# Patient Record
Sex: Male | Born: 1995 | Race: Black or African American | Hispanic: No | Marital: Single | Smoking: Never smoker
Health system: Southern US, Community
[De-identification: ages and names within clinical notes are randomized; demographics above are authoritative.]

## PROBLEM LIST (undated history)

## (undated) DIAGNOSIS — J45909 Unspecified asthma, uncomplicated: Secondary | ICD-10-CM

---

## 2013-10-18 ENCOUNTER — Encounter (HOSPITAL_COMMUNITY): Payer: Self-pay | Admitting: Emergency Medicine

## 2013-10-18 ENCOUNTER — Emergency Department (HOSPITAL_COMMUNITY)
Admission: EM | Admit: 2013-10-18 | Discharge: 2013-10-19 | Disposition: A | Discharge status: Home / Self Care | Payer: No Typology Code available for payment source | Attending: Emergency Medicine | Admitting: Emergency Medicine

## 2013-10-18 DIAGNOSIS — R51 Headache: Secondary | ICD-10-CM

## 2013-10-18 DIAGNOSIS — Y92410 Unspecified street and highway as the place of occurrence of the external cause: Secondary | ICD-10-CM | POA: Insufficient documentation

## 2013-10-18 DIAGNOSIS — Y9389 Activity, other specified: Secondary | ICD-10-CM | POA: Insufficient documentation

## 2013-10-18 DIAGNOSIS — S0990XA Unspecified injury of head, initial encounter: Secondary | ICD-10-CM | POA: Insufficient documentation

## 2013-10-18 DIAGNOSIS — R42 Dizziness and giddiness: Secondary | ICD-10-CM | POA: Insufficient documentation

## 2013-10-18 DIAGNOSIS — R519 Headache, unspecified: Secondary | ICD-10-CM

## 2013-10-18 DIAGNOSIS — J45909 Unspecified asthma, uncomplicated: Secondary | ICD-10-CM | POA: Insufficient documentation

## 2013-10-18 HISTORY — DX: Unspecified asthma, uncomplicated: J45.909

## 2013-10-18 MED ORDER — KETOROLAC TROMETHAMINE 60 MG/2ML IM SOLN
60.0000 mg | Freq: Once | INTRAMUSCULAR | Status: AC
  Administered 2013-10-18: 60 mg via INTRAMUSCULAR
  Filled 2013-10-18: qty 2
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
## 2013-10-18 NOTE — Discharge Instructions (Signed)
May continue taking over the counter medications as needed for headache-- motrin, excedrin migraine or tension headache, tylenol, etc Return to the ED for new or worsening symptoms
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
## 2013-10-19 NOTE — ED Notes (Signed)
pateint went home during down time

## 2013-10-19 NOTE — ED Provider Notes (Signed)
Medical screening examination/treatment/procedure(s) were performed by non-physician practitioner and as supervising physician I was immediately available for consultation/collaboration.  EKG Interpretation   None        Irmgard Rampersaud R. Zanaiya Calabria, MD 10/19/13 1836 

## 2015-12-17 ENCOUNTER — Encounter (HOSPITAL_COMMUNITY): Payer: Self-pay | Admitting: Emergency Medicine

## 2015-12-17 ENCOUNTER — Emergency Department (HOSPITAL_COMMUNITY)
Admission: EM | Admit: 2015-12-17 | Discharge: 2015-12-17 | Disposition: A | Discharge status: Home / Self Care | Payer: No Typology Code available for payment source | Attending: Emergency Medicine | Admitting: Emergency Medicine

## 2015-12-17 DIAGNOSIS — K120 Recurrent oral aphthae: Secondary | ICD-10-CM

## 2015-12-17 DIAGNOSIS — J069 Acute upper respiratory infection, unspecified: Secondary | ICD-10-CM | POA: Insufficient documentation

## 2015-12-17 DIAGNOSIS — J45909 Unspecified asthma, uncomplicated: Secondary | ICD-10-CM | POA: Insufficient documentation

## 2015-12-17 DIAGNOSIS — Z7951 Long term (current) use of inhaled steroids: Secondary | ICD-10-CM | POA: Insufficient documentation

## 2015-12-17 MED ORDER — MAGIC MOUTHWASH W/LIDOCAINE: 5.0000 mL | Freq: Three times a day (TID) | ORAL | Status: AC

## 2015-12-17 NOTE — ED Notes (Signed)
Per pt, states dry mouth since eating something cooked in oil that shrimp was fried in, states cough and congestion

## 2015-12-17 NOTE — ED Provider Notes (Signed)
CSN: [id number]     Arrival date & time 12/17/15  1318 History  By signing my name below, I, Gabriel Peterson, attest that this documentation has been prepared under the direction and in the presence of Arthor CaptainAbigail Edrees Valent, PA-C. Electronically Signed: Ronney LionSuzanne Peterson, ED Scribe. 12/17/2015. 4:01 PM.    Chief Complaint  Patient presents with  . dry mouth    The history is provided by the patient. No language interpreter was used.   HPI Comments: Gabriel Peterson is a 20 y.o. male with a history of asthma, who presents to the Emergency Department complaining of the sensation of a dry mouth that began about 4 days ago, when he woke up. 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 However, he states there was no actual shrimp in his meal but was told that the restaurant sometimes cooks shrimp in the kitchen. He states he felt no immediate symptoms but woke up the next day with the sensation of a dry mouth. He states that yesterday, he also developed a rhinorrhea, cough, and intermittent chest pain that only occurs with his cough. He states he has never experienced this same dry mouth sensation before. Patient states he is able to chew and swallow without difficulty. He states he had taken Benadryl twice with no relief to his dry mouth sensation. He denies eye itching.   Past Medical History  Diagnosis Date  . Asthma    History reviewed. No pertinent past surgical history. No family history on file. Social History  Substance Use Topics  . Smoking status: Never Smoker   . Smokeless tobacco: Never Used  . Alcohol Use: No    Review of Systems  HENT: Positive for rhinorrhea. Negative for trouble swallowing.   Eyes: Negative for itching.  Respiratory: Positive for cough.   Cardiovascular: Positive for chest pain (only with cough).    Allergies  Shrimp  Home Medications   Prior to Admission medications    Medication Sig Start Date End Date Taking? Authorizing Provider  Fluticasone-Salmeterol (ADVAIR DISKUS) 250-50 MCG/DOSE AEPB Inhale 1 puff into the lungs 2 (two) times daily.    Historical Provider, MD   BP 124/74 mmHg  Pulse 73  Temp(Src) 98.7 F (37.1 C) (Oral)  Resp 18  SpO2 100% Physical Exam  Constitutional: He is oriented to person, place, and time. He appears well-developed and well-nourished. No distress.  HENT:  Head: Normocephalic and atraumatic.  Mouth/Throat: Uvula is midline, oropharynx is clear and moist and mucous membranes are normal. No oropharyngeal exudate.  Small ulceration on the lip. Mucous membranes are moist.   Eyes: Conjunctivae and EOM are normal.  Neck: Neck supple. No tracheal deviation present.  Cardiovascular: Normal rate.   Pulmonary/Chest: Effort normal. No respiratory distress.  Musculoskeletal: Normal range of motion.  Neurological: He is alert and oriented to person, place, and time.  Skin: Skin is warm and dry.  Psychiatric: He has a normal mood and affect. His behavior is normal.  Nursing note and vitals reviewed.   ED Course  Procedures (including critical care time)  DIAGNOSTIC STUDIES: Oxygen Saturation is 100% on RA, normal by my interpretation.    COORDINATION OF CARE: 3:53 PM - Discussed treatment plan with pt at bedside which includes Rx magic mouthwash. Pt verbalized understanding and agreed to plan.    MDM   Final diagnoses:  URI (upper respiratory infection)  Aphthous stomatitis  Patient's symptoms are consistent with URI, likely viral etiology. Discussed that antibiotics are not indicated for viral infections. Pt will be discharged with symptomatic treatment (magic mouthwash).  Verbalizes understanding and is agreeable with plan. Pt is hemodynamically stable & in NAD prior to dc.   I personally performed the services described in this documentation, which was scribed in my presence. The recorded information has been  reviewed and is accurate.         Arthor Captain, PA-C 12/17/15 1912  Loren Racer, MD 12/17/15 [phone]

## 2015-12-17 NOTE — ED Notes (Signed)
Pt states "I have c/p that comes and goes on & off in the winter and then every now & then.  I have an inhaler but hardly use it.  I've had it x 3 yrs.  I was @ [employer] Thursday and they have shrimp.  I'm allergic to shrimp.  I woke up Friday that I felt the excessive dry mouth.  And my mouth is really, really red."

## 2015-12-17 NOTE — Discharge Instructions (Signed)
Oral Ulcers °Oral ulcers are painful, shallow sores around the lining of the mouth. They can affect the gums, the inside of the lips, and the cheeks. (Sores on the outside of the lips and on the face are different.) They typically first occur in school-aged children and teenagers. Oral ulcers may also be called canker sores or cold sores. °CAUSES  °Canker sores and cold sores can be caused by many factors including: °· Infection. °· Injury. °· Sun exposure. °· Medications. °· Emotional stress. °· Food allergies. °· Vitamin deficiencies. °· Toothpastes containing sodium lauryl sulfate. °The herpes virus can be the cause of mouth ulcers. The first infection can be severe and cause 10 or more ulcers on the gums, tongue, and lips with fever and difficulty in swallowing. This infection usually occurs between the ages of 1 and 3 years.  °SYMPTOMS  °The typical sore is about ¼ inch (6 mm) in size and is an oval or round ulcer with red borders. °DIAGNOSIS  °Your caregiver can diagnose simple oral ulcers by examination. Additional testing is usually not required.  °TREATMENT  °Treatment is aimed at pain relief. Generally, oral ulcers resolve by themselves within 1 to 2 weeks without medication and are not contagious unless caused by herpes (and other viruses). Antibiotics are not effective with mouth sores. Avoid direct contact with others until the ulcer is completely healed. See your caregiver for follow-up care as recommended. Also: °· Offer a soft diet. °· Encourage plenty of fluids to prevent dehydration. Popsicles and milk shakes can be helpful. °· Avoid acidic and salty foods and drinks such as orange juice. °· Infants and young children will often refuse to drink because of pain. Using a teaspoon, cup, or syringe to give small amounts of fluids frequently can help prevent dehydration. °· Cold compresses on the face may help reduce pain. °· Pain medication can help control soreness. °· A solution of diphenhydramine  mixed with a liquid antacid can be useful to decrease the soreness of ulcers. Consult a caregiver for the dosing. °· Liquids or ointments with a numbing ingredient may be helpful when used as recommended. °· Older children and teenagers can rinse their mouth with a salt-water mixture (1/2 teaspoon of salt in 8 ounces of water) four times a day. This treatment is uncomfortable but may reduce the time the ulcers are present. °· There are many over-the-counter throat lozenges and medications available for oral ulcers. Their effectiveness has not been studied. °· Consult your medical caregiver prior to using homeopathic treatments for oral ulcers. °SEEK MEDICAL CARE IF:  °· You think your child needs to be seen. °· The pain worsens and you cannot control it. °· There are 4 or more ulcers. °· The lips and gums begin to bleed and crust. °· A single mouth ulcer is near a tooth that is causing a toothache or pain. °· Your child has a fever, swollen face, or swollen glands. °· The ulcers began after starting a medication. °· Mouth ulcers keep reoccurring or last more than 2 weeks. °· You think your child is not taking adequate fluids. °SEEK IMMEDIATE MEDICAL CARE IF:  °· Your child has a high fever. °· Your child is unable to swallow or becomes dehydrated. °· Your child looks or acts very ill. °· An ulcer caused by a chemical your child accidentally put in their mouth. °  °This information is not intended to replace advice given to you by your health care provider. Make sure you discuss any   questions you have with your health care provider.   Document Released: 09/24/2004 Document Revised: 09/07/2014 Document Reviewed: 01/02/2015 Elsevier Interactive Patient Education 2016 [employer]  Stomatitis Stomatitis is a condition that causes inflammation in your mouth. It can affect a part of your mouth or your whole mouth. The condition often affects your cheek, teeth, gums, lips, and tongue. Stomatitis can also affect the  mucous membranes that surround your mouth (mucosa). Pain from stomatitis can make it hard for you to eat or drink. Severe cases of this condition can lead to dehydration or poor nutrition. CAUSES Common causes of this condition include:  Viruses, such as cold sores or oral herpes and shingles.  Canker sores.  Bacterial infections.  Fungus or yeast infections, such as oral thrush.  Not getting adequate nutrition.  Injury to your mouth. This can be from:  Dentures or braces that do not fit well.  Biting your tongue or cheek.  Burning your mouth.  Having sharp or broken teeth.  Gum disease.  Using tobacco, especially chewing tobacco.  Allergies to foods, medicines, or substances that are used in your mouth.  Medicines, including cancer medicines (chemotherapy), antihistamines, and seizure medicines. In some cases, the cause may not be known. RISK FACTORS This condition is more likely to develop in people who:  Have poor oral hygiene or poor nutrition.  Have any condition that causes a dry mouth.  Are under a lot of physical or emotional stress.  Have any condition that weakens the body's defense system (immune system).  Are being treated for cancer.  Smoke. SYMPTOMS The most common symptoms of this condition are pain, swelling, and redness inside your mouth. The pain may feel like burning or stinging. It may get worse from eating or drinking. Other symptoms include:  Painful, shallow sores (ulcers) in the mouth.  Blisters in the mouth.  Bleeding gums.  Swollen gums.  Irritability and fatigue.  Bad breath.  Bad taste in the mouth.  Fever. DIAGNOSIS This condition is diagnosed with a physical exam to check for bleeding gums and mouth ulcers. You may also have other tests, including:  Blood tests to look for infection or vitamin deficiencies.  Mouth swab to get a fluid sample to test for bacteria (culture).  Tissue sample from an ulcer to examine  under a microscope (biopsy). TREATMENT Treatment for stomatitis depends on the cause. Treatment may include medicines, such as:  Over-the counter (OTC) pain medicines.  Topical anesthetic to numb the area if you have severe pain.  Antibiotics to treat a bacterial infection.  Antifungals to treat a fungal infection.  Antivirals to treat a viral infection.  Mouth rinses that contain steroids to reduce the swelling in your mouth.  Other medicines to coat or numb your mouth. HOME CARE INSTRUCTIONS Medicines  Take medicines only as directed by your health care provider.  If you were prescribed an antibiotic, finish all of it even if you start to feel better. Lifestyle  Practice good oral hygiene:  Gently brush your teeth with a soft, nylon-bristled toothbrush two times each day.  Floss your teeth every day.  Have your teeth cleaned regularly, as recommended by your dentist.  Eat a balanced diet.Do not eat:  Spicy foods.  Citrus, such as oranges.  Foods that have sharp edges, such as chips.  Avoid any foods or other allergens that you think may be causing your stomatitis.  If you have dentures, make sure that they are properly fitted.  Do not use  any tobacco products, including cigarettes, chewing tobacco, or electronic cigarettes. If you need help quitting, ask your health care provider.  Find ways to reduce stress. Try yoga or meditation. Ask your health care provider for other ideas. General Instructions  Use a salt-water rinse for pain as directed by your health care provider. Mix 1 tsp of salt in 2 cups of water.  Drink enough fluid to keep your urine clear or pale yellow. This will keep you hydrated. SEEK MEDICAL CARE IF:  Your symptoms get worse.  You develop new symptoms, especially:  A rash.  New symptoms that do not involve your mouth area.  Your symptoms last longer than three weeks.  Your stomatitis goes away and then returns.  You have a  harder time eating and drinking normally.  You have increasing fatigue or weakness.  You lose your appetite or you feel nauseous.  You have a fever.   This information is not intended to replace advice given to you by your health care provider. Make sure you discuss any questions you have with your health care provider.   Document Released: 06/14/2007 Document Revised: 01/01/2015 Document Reviewed: 08/13/2014 Elsevier Interactive Patient Education 2016 [employer]  Upper Respiratory Infection, Adult Most upper respiratory infections (URIs) are a viral infection of the air passages leading to the lungs. A URI affects the nose, throat, and upper air passages. The most common type of URI is nasopharyngitis and is typically referred to as "the common cold." URIs run their course and usually go away on their own. Most of the time, a URI does not require medical attention, but sometimes a bacterial infection in the upper airways can follow a viral infection. This is called a secondary infection. Sinus and middle ear infections are common types of secondary upper respiratory infections. Bacterial pneumonia can also complicate a URI. A URI can worsen asthma and chronic obstructive pulmonary disease (COPD). Sometimes, these complications can require emergency medical care and may be life threatening.  CAUSES Almost all URIs are caused by viruses. A virus is a type of germ and can spread from one person to another.  RISKS FACTORS You may be at risk for a URI if:   You smoke.   You have chronic heart or lung disease.  You have a weakened defense (immune) system.   You are very young or very old.   You have nasal allergies or asthma.  You work in crowded or poorly ventilated areas.  You work in health care facilities or schools. SIGNS AND SYMPTOMS  Symptoms typically develop 2-3 days after you come in contact with a cold virus. Most viral URIs last 7-10 days. However, viral URIs from  the influenza virus (flu virus) can last 14-18 days and are typically more severe. Symptoms may include:   Runny or stuffy (congested) nose.   Sneezing.   Cough.   Sore throat.   Headache.   Fatigue.   Fever.   Loss of appetite.   Pain in your forehead, behind your eyes, and over your cheekbones (sinus pain).  Muscle aches.  DIAGNOSIS  Your health care provider may diagnose a URI by:  Physical exam.  Tests to check that your symptoms are not due to another condition such as:  Strep throat.  Sinusitis.  Pneumonia.  Asthma. TREATMENT  A URI goes away on its own with time. It cannot be cured with medicines, but medicines may be prescribed or recommended to relieve symptoms. Medicines may help:  Reduce your  fever.  Reduce your cough.  Relieve nasal congestion. HOME CARE INSTRUCTIONS   Take medicines only as directed by your health care provider.   Gargle warm saltwater or take cough drops to comfort your throat as directed by your health care provider.  Use a warm mist humidifier or inhale steam from a shower to increase air moisture. This may make it easier to breathe.  Drink enough fluid to keep your urine clear or pale yellow.   Eat soups and other clear broths and maintain good nutrition.   Rest as needed.   Return to work when your temperature has returned to normal or as your health care provider advises. You may need to stay home longer to avoid infecting others. You can also use a face mask and careful hand washing to prevent spread of the virus.  Increase the usage of your inhaler if you have asthma.   Do not use any tobacco products, including cigarettes, chewing tobacco, or electronic cigarettes. If you need help quitting, ask your health care provider. PREVENTION  The best way to protect yourself from getting a cold is to practice good hygiene.   Avoid oral or hand contact with people with cold symptoms.   Wash your hands often  if contact occurs.  There is no clear evidence that vitamin C, vitamin E, echinacea, or exercise reduces the chance of developing a cold. However, it is always recommended to get plenty of rest, exercise, and practice good nutrition.  SEEK MEDICAL CARE IF:   You are getting worse rather than better.   Your symptoms are not controlled by medicine.   You have chills.  You have worsening shortness of breath.  You have brown or red mucus.  You have yellow or brown nasal discharge.  You have pain in your face, especially when you bend forward.  You have a fever.  You have swollen neck glands.  You have pain while swallowing.  You have white areas in the back of your throat. SEEK IMMEDIATE MEDICAL CARE IF:   You have severe or persistent:  Headache.  Ear pain.  Sinus pain.  Chest pain.  You have chronic lung disease and any of the following:  Wheezing.  Prolonged cough.  Coughing up blood.  A change in your usual mucus.  You have a stiff neck.  You have changes in your:  Vision.  Hearing.  Thinking.  Mood. MAKE SURE YOU:   Understand these instructions.  Will watch your condition.  Will get help right away if you are not doing well or get worse.   This information is not intended to replace advice given to you by your health care provider. Make sure you discuss any questions you have with your health care provider.   Document Released: 02/10/2001 Document Revised: 01/01/2015 Document Reviewed: 11/22/2013 Elsevier Interactive Patient Education [employer]

## 2018-09-09 ENCOUNTER — Encounter (HOSPITAL_COMMUNITY): Payer: Self-pay | Admitting: Emergency Medicine

## 2018-09-09 ENCOUNTER — Emergency Department (HOSPITAL_COMMUNITY): Payer: Self-pay

## 2018-09-09 ENCOUNTER — Emergency Department (HOSPITAL_COMMUNITY)
Admission: EM | Admit: 2018-09-09 | Discharge: 2018-09-09 | Disposition: A | Discharge status: Home / Self Care | Payer: Self-pay | Attending: Emergency Medicine | Admitting: Emergency Medicine

## 2018-09-09 ENCOUNTER — Other Ambulatory Visit: Payer: Self-pay

## 2018-09-09 DIAGNOSIS — Y9367 Activity, basketball: Secondary | ICD-10-CM | POA: Insufficient documentation

## 2018-09-09 DIAGNOSIS — T148XXA Other injury of unspecified body region, initial encounter: Secondary | ICD-10-CM

## 2018-09-09 DIAGNOSIS — Z79899 Other long term (current) drug therapy: Secondary | ICD-10-CM | POA: Insufficient documentation

## 2018-09-09 DIAGNOSIS — S29011A Strain of muscle and tendon of front wall of thorax, initial encounter: Secondary | ICD-10-CM | POA: Insufficient documentation

## 2018-09-09 DIAGNOSIS — R0781 Pleurodynia: Secondary | ICD-10-CM | POA: Insufficient documentation

## 2018-09-09 DIAGNOSIS — J45909 Unspecified asthma, uncomplicated: Secondary | ICD-10-CM | POA: Insufficient documentation

## 2018-09-09 DIAGNOSIS — Y92310 Basketball court as the place of occurrence of the external cause: Secondary | ICD-10-CM | POA: Insufficient documentation

## 2018-09-09 DIAGNOSIS — X58XXXA Exposure to other specified factors, initial encounter: Secondary | ICD-10-CM | POA: Insufficient documentation

## 2018-09-09 DIAGNOSIS — Y999 Unspecified external cause status: Secondary | ICD-10-CM | POA: Insufficient documentation

## 2018-09-09 MED ORDER — IBUPROFEN 200 MG PO TABS
600.0000 mg | ORAL_TABLET | Freq: Once | ORAL | Status: AC
  Administered 2018-09-09: 600 mg via ORAL
  Filled 2018-09-09: qty 3

## 2018-09-09 MED ORDER — NAPROXEN 500 MG PO TABS: 500.0000 mg | ORAL_TABLET | Freq: Two times a day (BID) | ORAL | 0 refills | Status: AC

## 2018-09-09 MED ORDER — CYCLOBENZAPRINE HCL 10 MG PO TABS: 10.0000 mg | ORAL_TABLET | Freq: Two times a day (BID) | ORAL | 0 refills | Status: AC | PRN

## 2018-09-09 NOTE — ED Provider Notes (Signed)
[facility]-EMERGENCY DEPT Provider Note   CSN: [id number] Arrival date & time: 09/09/18  1541     History   Chief Complaint Chief Complaint  Patient presents with  . Abdominal Injury    HPI Gabriel Peterson is a 23 y.o. male who presents to the ED for left rib pain. The pain started after the patient was playing basketball over a week ago. Patient states he was running and threw the ball and then got really low to the ground and after that the pain started. Patient reports he continues to play every day. Patient states he has not taken anything for pain but has applied ice. The swelling went away with the ice.patient reports that last night he slid off the couch and then the pain got much worse. Patient states that today it hurts to take a deep breath.   HPI  Past Medical History:  Diagnosis Date  . Asthma     There are no active problems to display for this patient.   History reviewed. No pertinent surgical history.      Home Medications    Prior to Admission medications   Medication Sig Start Date End Date Taking? Authorizing Provider  cyclobenzaprine (FLEXERIL) 10 MG tablet Take 1 tablet (10 mg total) by mouth 2 (two) times daily as needed for muscle spasms. 09/09/18   Janne NapoleonNeese,  M, NP  Fluticasone-Salmeterol (ADVAIR DISKUS) 250-50 MCG/DOSE AEPB Inhale 1 puff into the lungs 2 (two) times daily.    [provider]  magic mouthwash w/lidocaine SOLN Take 5 mLs by mouth 3 (three) times daily. Swish and swallow 12/17/15   Arthor CaptainHarris, Abigail, PA-C  naproxen (NAPROSYN) 500 MG tablet Take 1 tablet (500 mg total) by mouth 2 (two) times daily. 09/09/18   Janne NapoleonNeese,  M, NP    Family History History reviewed. No pertinent family history.  Social History Social History   Tobacco Use  . Smoking status: Never Smoker  . Smokeless tobacco: Never Used  Substance Use Topics  . Alcohol use: No  . Drug use: No     Allergies   Shrimp [shellfish  allergy]   Review of Systems Review of Systems  Constitutional: Negative for chills and fever.  HENT: Negative.   Eyes: Negative for visual disturbance.  Respiratory: Negative for shortness of breath.   Cardiovascular:       Left rib pain  Gastrointestinal: Negative for abdominal pain, nausea and vomiting.  Genitourinary: Negative for dysuria, flank pain, frequency and hematuria.  Musculoskeletal: Positive for arthralgias.  Skin: Negative for wound.  Neurological: Negative for headaches.  Psychiatric/Behavioral: Negative for confusion.     Physical Exam Updated Vital Signs BP 110/74   Pulse (!) 54   Temp 98.5 F (36.9 C) (Oral)   Resp 16   Ht 5\' 7"  (1.702 m)   Wt 60.3 kg   SpO2 97%   BMI 20.83 kg/m   Physical Exam Vitals signs and nursing note reviewed.  Constitutional:      General: He is not in acute distress.    Appearance: He is well-developed.  HENT:     Head: Normocephalic.     Right Ear: Tympanic membrane normal.     Left Ear: Tympanic membrane normal.     Nose: Nose normal.     Mouth/Throat:     Mouth: Mucous membranes are moist.     Pharynx: Oropharynx is clear.  Eyes:     Extraocular Movements: Extraocular movements intact.     Conjunctiva/sclera:  Conjunctivae normal.     Pupils: Pupils are equal, round, and reactive to light.  Neck:     Musculoskeletal: Neck supple.  Cardiovascular:     Rate and Rhythm: Normal rate and regular rhythm.  Pulmonary:     Effort: Pulmonary effort is normal.     Breath sounds: Normal breath sounds.     Comments: Left anterior rib tenderness on palpation. Abdominal:     Palpations: Abdomen is soft.     Tenderness: There is no abdominal tenderness. There is no right CVA tenderness or left CVA tenderness.  Musculoskeletal: Normal range of motion.  Skin:    General: Skin is warm and dry.  Neurological:     Mental Status: He is alert and oriented to person, place, and time.  Psychiatric:        Mood and Affect: Mood  normal.      ED Treatments / Results  Labs (all labs ordered are listed, but only abnormal results are displayed) Labs Reviewed - No data to display Radiology Dg Ribs Unilateral W/chest Left  Result Date: 09/09/2018 CLINICAL DATA:  Left rib pain since an injury playing basketball two days ago. Shortness of breath. EXAM: LEFT RIBS AND CHEST - 3+ VIEW COMPARISON:  None. FINDINGS: No fracture or other bone lesions are seen involving the ribs. There is no evidence of pneumothorax or pleural effusion. Both lungs are clear. Heart size and mediastinal contours are within normal limits. IMPRESSION: Negative. Electronically Signed   By: Francene BoyersJames  Maxwell M.D.   On: 09/09/2018 17:17    Procedures Procedures (including critical care time)  Medications Ordered in ED Medications  ibuprofen (ADVIL,MOTRIN) tablet 600 mg (600 mg Oral Given 09/09/18 1722)     Initial Impression / Assessment and Plan / ED Course  I have reviewed the triage vital signs and the nursing notes.  23 y.o. male here with left rib pain stable for d/c with no acute findings on x-ray. Discussed with the patient clinical and x-ray findings and plan of care and patient agrees with plan. Will start muscle relaxer for muscle spasm and NSAIDs. Return precautions discussed.  Final Clinical Impressions(s) / ED Diagnoses   Final diagnoses:  Rib pain on left side  Muscle strain    ED Discharge Orders         Ordered    naproxen (NAPROSYN) 500 MG tablet  2 times daily     09/09/18 1727    cyclobenzaprine (FLEXERIL) 10 MG tablet  2 times daily PRN     09/09/18 1727           Damian Leavelleese, High[place] HolidayHope M, NP 09/09/18 2244    Samuel JesterMcManus, Kathleen, DO 09/11/18 1654

## 2018-09-09 NOTE — ED Triage Notes (Addendum)
Reports left rib injury while playing basketball last Wednesday.  States he passed the ball and has been uncomfortable since. Additionally endorses shortness of breath.

## 2018-09-09 NOTE — Discharge Instructions (Addendum)
Do not drive while taking the muscle relaxer as it will make you sleepy. Return here for worsening symptoms.

## 2019-06-15 ENCOUNTER — Other Ambulatory Visit: Payer: Self-pay

## 2019-06-15 DIAGNOSIS — Z20822 Contact with and (suspected) exposure to covid-19: Secondary | ICD-10-CM

## 2019-06-17 LAB — NOVEL CORONAVIRUS, NAA: SARS-CoV-2, NAA: DETECTED — AB

## 2021-10-06 ENCOUNTER — Emergency Department (HOSPITAL_COMMUNITY)
Admission: EM | Admit: 2021-10-06 | Discharge: 2021-10-07 | Disposition: A | Discharge status: Home / Self Care | Payer: Self-pay | Attending: Emergency Medicine | Admitting: Emergency Medicine

## 2021-10-06 ENCOUNTER — Encounter (HOSPITAL_COMMUNITY): Payer: Self-pay

## 2021-10-06 ENCOUNTER — Other Ambulatory Visit: Payer: Self-pay

## 2021-10-06 ENCOUNTER — Emergency Department (HOSPITAL_COMMUNITY): Payer: Self-pay

## 2021-10-06 DIAGNOSIS — M546 Pain in thoracic spine: Secondary | ICD-10-CM | POA: Insufficient documentation

## 2021-10-06 DIAGNOSIS — R079 Chest pain, unspecified: Secondary | ICD-10-CM | POA: Insufficient documentation

## 2021-10-06 LAB — CBC
HCT: 46.7 % (ref 39.0–52.0)
Hemoglobin: 14.6 g/dL (ref 13.0–17.0)
MCH: 24.4 pg — ABNORMAL LOW (ref 26.0–34.0)
MCHC: 31.3 g/dL (ref 30.0–36.0)
MCV: 78 fL — ABNORMAL LOW (ref 80.0–100.0)
Platelets: 270 10*3/uL (ref 150–400)
RBC: 5.99 MIL/uL — ABNORMAL HIGH (ref 4.22–5.81)
RDW: 12.9 % (ref 11.5–15.5)
WBC: 8.9 10*3/uL (ref 4.0–10.5)
nRBC: 0 % (ref 0.0–0.2)

## 2021-10-06 LAB — BASIC METABOLIC PANEL
Anion gap: 8 (ref 5–15)
BUN: 12 mg/dL (ref 6–20)
CO2: 28 mmol/L (ref 22–32)
Calcium: 9.1 mg/dL (ref 8.9–10.3)
Chloride: 100 mmol/L (ref 98–111)
Creatinine, Ser: 0.89 mg/dL (ref 0.61–1.24)
GFR, Estimated: >60 mL/min (ref >60)
Glucose, Bld: 103 mg/dL — ABNORMAL HIGH (ref 70–99)
Potassium: 4 mmol/L (ref 3.5–5.1)
Sodium: 136 mmol/L (ref 135–145)

## 2021-10-06 LAB — TROPONIN I (HIGH SENSITIVITY): Troponin I (High Sensitivity): 3 ng/L (ref <18)

## 2021-10-06 NOTE — ED Provider Notes (Signed)
[facility] DEPT Provider Note   CSN: [id number] Arrival date & time: 10/06/21  2215     History  Chief Complaint  Patient presents with   Chest Pain    Gabriel Peterson is a 26 y.o. male.  HPI Patient is a 26 year old male with a history of asthma who presents to the emergency department due to chest and back pain.  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  He states that he has been experiencing upper back pain for about a month due to his job.  Worse when sitting for long periods of time.  He states yesterday he then began experiencing pain along the central and left side of his chest.  Describes it as stabbing.  He states yesterday it worsened when moving his left arm but this does not worsen it today.  Now he notes that it worsens when taking deep breaths.  Denies any leg swelling or calf pain.  No nausea, vomiting, diaphoresis.  Denies any drug or tobacco use.  States that he rarely drinks alcohol.    Home Medications Prior to Admission medications   Medication Sig Start Date End Date Taking? Authorizing Provider  cyclobenzaprine (FLEXERIL) 10 MG tablet Take 1 tablet (10 mg total) by mouth 2 (two) times daily as needed for muscle spasms. Patient not taking: Reported on 10/07/2021 09/09/18   Ashley Murrain, NP  magic mouthwash w/lidocaine SOLN Take 5 mLs by mouth 3 (three) times daily. Swish and swallow Patient not taking: Reported on 10/07/2021 12/17/15   Margarita Mail, PA-C  naproxen (NAPROSYN) 500 MG tablet Take 1 tablet (500 mg total) by mouth 2 (two) times daily. Patient not taking: Reported on 10/07/2021 09/09/18   Ashley Murrain, NP      Allergies    Shrimp [shellfish allergy]    Review of Systems   Review of Systems  All other systems reviewed and are negative. Ten systems reviewed and are negative for acute change, except as noted in the HPI.   Physical Exam Updated Vital Signs BP  102/65    Pulse 61    Temp 98.7 F (37.1 C) (Oral)    Resp 14    Ht 5\' 7"  (1.702 m)    Wt 60.3 kg    SpO2 98%    BMI 20.83 kg/m  Physical Exam Vitals and nursing note reviewed.  Constitutional:      General: He is not in acute distress.    Appearance: Normal appearance. He is well-developed and normal weight. He is not ill-appearing, toxic-appearing or diaphoretic.  HENT:     Head: Normocephalic and atraumatic.     Right Ear: External ear normal.     Left Ear: External ear normal.     Nose: Nose normal.     Mouth/Throat:     Mouth: Mucous membranes are moist.     Pharynx: Oropharynx is clear. No oropharyngeal exudate or posterior oropharyngeal erythema.  Eyes:     Extraocular Movements: Extraocular movements intact.  Cardiovascular:     Rate and Rhythm: Regular rhythm. Tachycardia present.     Pulses: Normal pulses.          Radial pulses are 2+ on the right side and 2+ on the left side.       Dorsalis pedis pulses are 2+ on the right side and 2+ on the left side.     Heart sounds: Normal heart sounds.  Heart sounds not distant. No murmur heard. No systolic murmur is present.  No diastolic murmur is present.    No friction rub. No gallop. No S3 or S4 sounds.  Pulmonary:     Effort: Pulmonary effort is normal. No tachypnea, accessory muscle usage or respiratory distress.     Breath sounds: Normal breath sounds. No stridor. No decreased breath sounds, wheezing, rhonchi or rales.  Chest:     Comments: No anterior chest wall tenderness. Abdominal:     General: Abdomen is flat.     Palpations: Abdomen is soft.     Tenderness: There is no abdominal tenderness.  Musculoskeletal:        General: Normal range of motion.     Cervical back: Normal range of motion and neck supple. No tenderness.     Right lower leg: No tenderness. No edema.     Left lower leg: No tenderness. No edema.     Comments: Mild tenderness noted along the left thoracic paraspinal musculature.  No midline spine  pain.  No pedal edema.  No calf tenderness.  2+ DP pulses.  Skin:    General: Skin is warm and dry.  Neurological:     General: No focal deficit present.     Mental Status: He is alert and oriented to person, place, and time.  Psychiatric:        Mood and Affect: Mood normal.        Behavior: Behavior normal.   ED Results / Procedures / Treatments   Labs (all labs ordered are listed, but only abnormal results are displayed) Labs Reviewed  BASIC METABOLIC PANEL - Abnormal; Notable for the following components:      Result Value   Glucose, Bld 103 (*)    All other components within normal limits  CBC - Abnormal; Notable for the following components:   RBC 5.99 (*)    MCV 78.0 (*)    MCH 24.4 (*)    All other components within normal limits  MAGNESIUM  D-DIMER, QUANTITATIVE  TROPONIN I (HIGH SENSITIVITY)  TROPONIN I (HIGH SENSITIVITY)   EKG EKG Interpretation  Date/Time:  Monday October 06 2021 22:28:36 EST Ventricular Rate:  63 PR Interval:  400 QRS Duration: 79 QT Interval:  330 QTC Calculation: 338 R Axis:   79 Text Interpretation: Sinus rhythm Prolonged PR interval Right atrial enlargement No previous ECGs available Confirmed by Ripley Fraise [phone]) on 10/06/2021 11:12:01 PM  Radiology DG Chest 2 View  Result Date: 10/06/2021 CLINICAL DATA:  Left chest pain starting yesterday. EXAM: CHEST - 2 VIEW COMPARISON:  09/09/2018 FINDINGS: The heart size and mediastinal contours are within normal limits. Both lungs are clear. The visualized skeletal structures are unremarkable. IMPRESSION: No active cardiopulmonary disease. Electronically Signed   By: Lucienne Capers M.D.   On: 10/06/2021 22:53    Procedures Procedures    Medications Ordered in ED Medications - No data to display  ED Course/ Medical Decision Making/ A&P                           Medical Decision Making Amount and/or Complexity of Data Reviewed Labs: ordered. Radiology: ordered.  Pt is a 26 y.o.  male who presents to the emergency department due to chest and back pain.  Labs: CBC with RBCs of 5.99, MCV of 78, MCH 24.4. BMP with a glucose of 103. Magnesium 2.2. Troponin of 3 with a repeat of 2. D-dimer less  than 0.27  Imaging: Chest x-ray shows no active cardiopulmonary disease.  I, Rayna Sexton, PA-C, personally reviewed and evaluated these images and lab results as part of my medical decision-making.  Unsure the source of the patient's symptoms.  No anterior chest wall tenderness.  ECG does not appear ischemic.  Chest x-ray is negative.  Troponins reassuring.  Given his age, lack of comorbidities, and reassuring lab work, doubt ACS at this time.  On my exam he was mildly tachycardic around 105 bpm.  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  No leg swelling or calf tenderness noted on my exam.  I obtained a D-dimer which was less than 0.27.  Doubt DVT/PE.  Exam and history does not appear consistent with dissection.  Patient's symptoms appear possibly musculoskeletal in nature.  He does have some mild tenderness along the left thoracic paraspinal musculature.  Likely due to his prolonged sitting for work.  Recommended stretching and light exercise.  Feel that the patient is stable for discharge at this time and he is agreeable.  Patient given strict return precautions.  His questions were answered and he was amicable at the time of discharge.  Note: Portions of this report may have been transcribed using voice recognition software. Every effort was made to ensure accuracy; however, inadvertent computerized transcription errors may be present.   Final Clinical Impression(s) / ED Diagnoses Final diagnoses:  Chest pain, unspecified type   Rx / DC Orders ED Discharge Orders     None         Rayna Sexton, PA-C 10/07/21 0130    Ripley Fraise, MD 10/07/21 [phone]

## 2021-10-06 NOTE — ED Triage Notes (Signed)
Pt states that he started having sharp left chest pains yesterday. Pt states that he feels a pain in his back.

## 2021-10-07 LAB — TROPONIN I (HIGH SENSITIVITY): Troponin I (High Sensitivity): 2 ng/L (ref <18)

## 2021-10-07 LAB — MAGNESIUM: Magnesium: 2.2 mg/dL (ref 1.7–2.4)

## 2021-10-07 LAB — D-DIMER, QUANTITATIVE: D-Dimer, Quant: <0.27 ug/mL-FEU (ref 0.00–0.50)

## 2021-10-07 NOTE — Discharge Instructions (Signed)
Please continue to monitor your symptoms closely.  If you develop any new or worsening symptoms please come back to the emergency department. 

## 2023-04-11 IMAGING — CR DG CHEST 2V
2 series · 2 of 2 positions shown · non-contrast
Comparison: 09/09/2018

CLINICAL DATA: Left chest pain starting yesterday.

EXAM:
CHEST - 2 VIEW

[w chest pa]
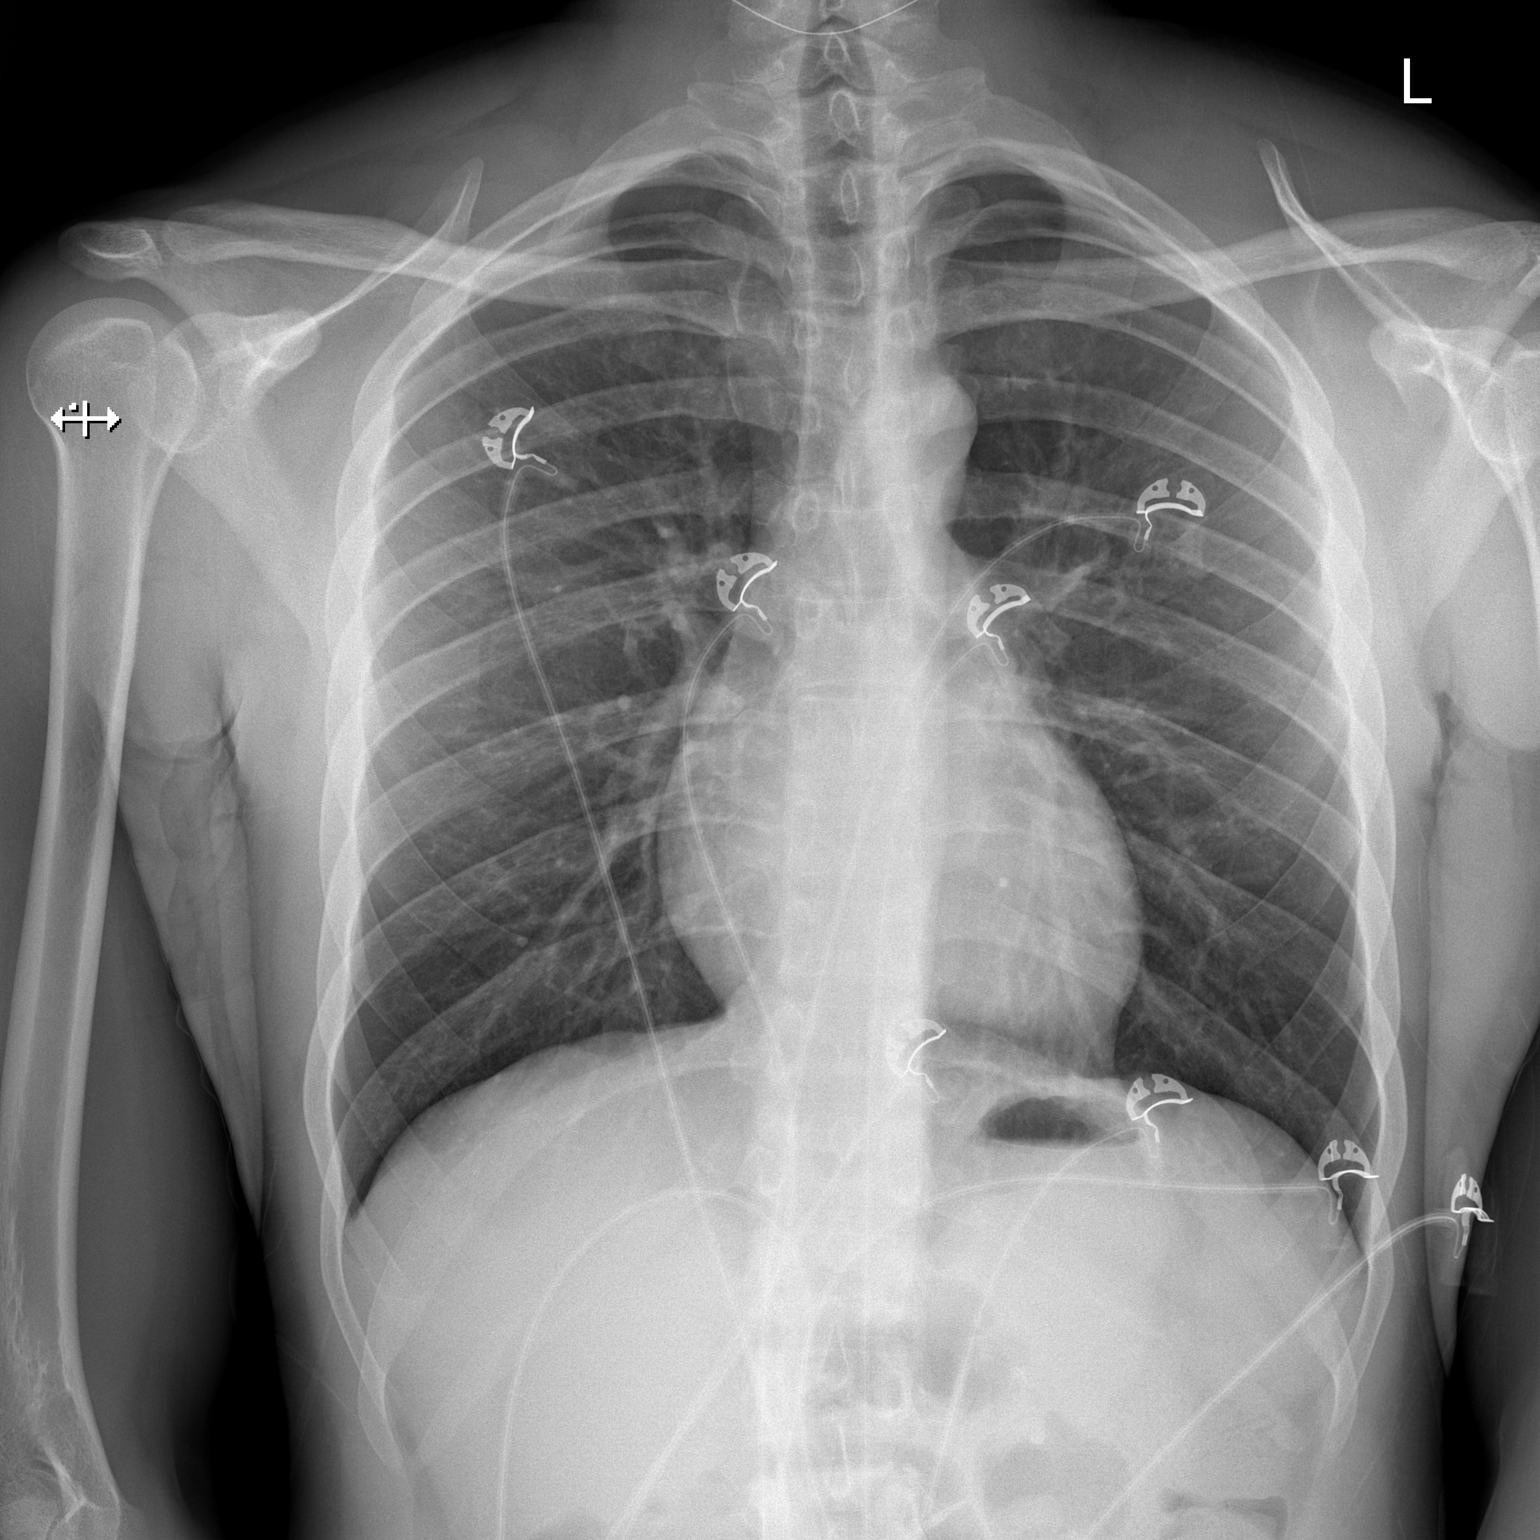

[w chest lat]
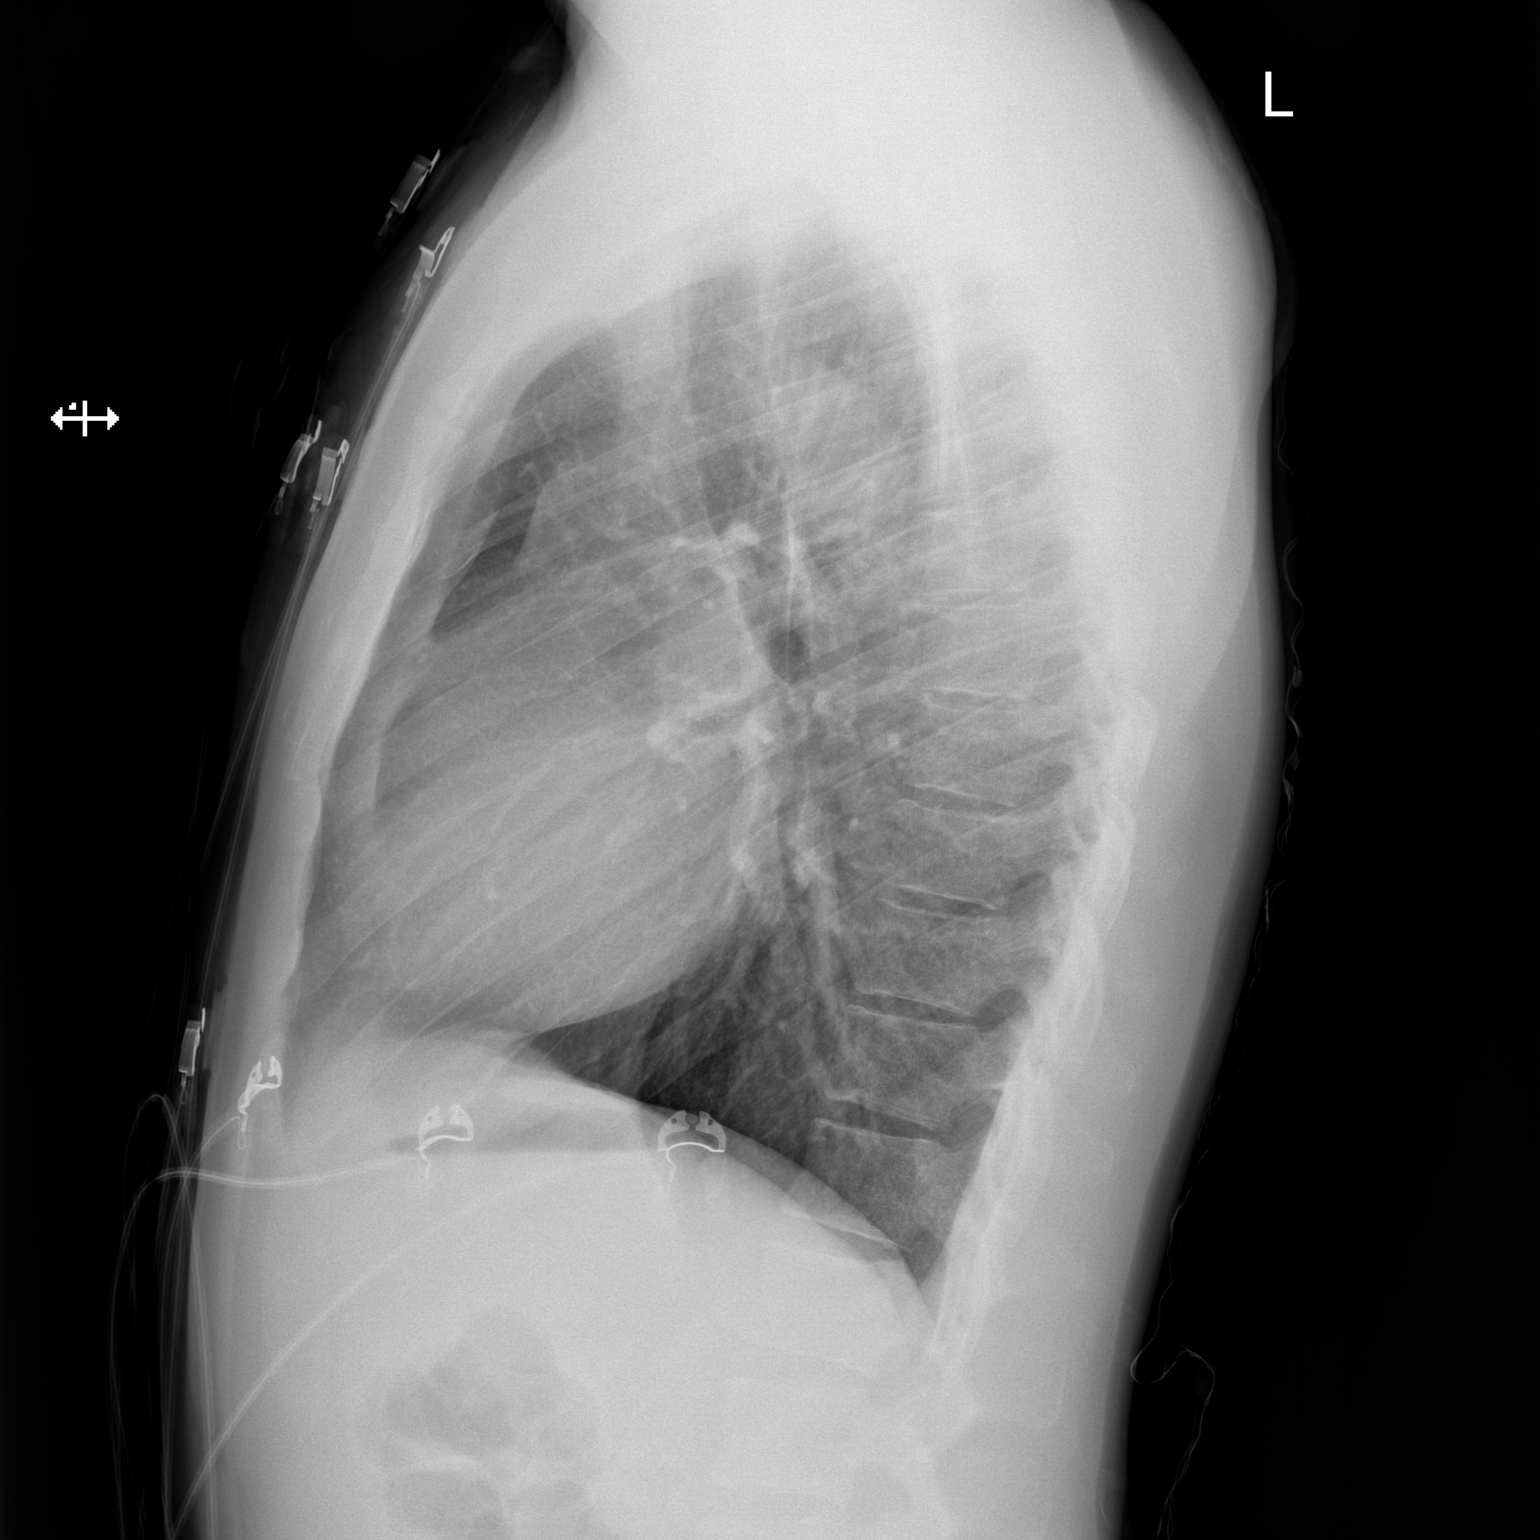

[2 of 2 positions shown; findings below may reference images not displayed]

FINDINGS: The heart size and mediastinal contours are within normal limits.
Both lungs are clear. The visualized skeletal structures are
unremarkable.
IMPRESSION: No active cardiopulmonary disease.

## 2024-08-10 ENCOUNTER — Ambulatory Visit
Admission: EM | Admit: 2024-08-10 | Discharge: 2024-08-10 | Disposition: A | Discharge status: Home / Self Care | Payer: Self-pay | Source: Ambulatory Visit | Attending: Emergency Medicine | Admitting: Emergency Medicine

## 2024-08-10 ENCOUNTER — Encounter: Payer: Self-pay | Admitting: Emergency Medicine

## 2024-08-10 DIAGNOSIS — Z113 Encounter for screening for infections with a predominantly sexual mode of transmission: Secondary | ICD-10-CM | POA: Insufficient documentation

## 2024-08-10 NOTE — ED Triage Notes (Signed)
 Pt presents for STD testing with blood work.  Denies any symptoms or known exposure.

## 2024-08-10 NOTE — ED Provider Notes (Signed)
 [facility]    CSN: [id number] Arrival date & time: 08/10/24  1803      History   Chief Complaint Chief Complaint  Patient presents with   SEXUALLY TRANSMITTED DISEASE    HPI Gabriel Peterson is a 28 y.o. male.   Patient presents to clinic requesting sexually-transmitted infection screening.  He is asymptomatic.  Denies penile discharge, dysuria, penile sores or lesions.  He is sexually active without a condom.  Reports his sexual partner recently got tested for STIs and she came back negative.    The history is provided by the patient and medical records.    Past Medical History:  Diagnosis Date   Asthma     There are no active problems to display for this patient.   History reviewed. No pertinent surgical history.     Home Medications    Prior to Admission medications  Medication Sig Start Date End Date Taking? Authorizing Provider  cyclobenzaprine  (FLEXERIL ) 10 MG tablet Take 1 tablet (10 mg total) by mouth 2 (two) times daily as needed for muscle spasms. Patient not taking: Reported on 10/07/2021 09/09/18   Jamelle Lorrayne HERO, NP  magic mouthwash w/lidocaine  SOLN Take 5 mLs by mouth 3 (three) times daily. Swish and swallow Patient not taking: Reported on 10/07/2021 12/17/15   Harris, Abigail, PA-C  naproxen  (NAPROSYN ) 500 MG tablet Take 1 tablet (500 mg total) by mouth 2 (two) times daily. Patient not taking: Reported on 10/07/2021 09/09/18   Jamelle Lorrayne HERO, NP    Family History History reviewed. No pertinent family history.  Social History Social History[1]   Allergies   Shrimp [shellfish allergy]   Review of Systems Review of Systems  Per HPI  Physical Exam Triage Vital Signs ED Triage Vitals [08/10/24 1829]  Encounter Vitals Group     BP 122/79     Girls Systolic BP Percentile      Girls Diastolic BP Percentile      Boys Systolic BP Percentile      Boys Diastolic BP Percentile      Pulse Rate 63     Resp 16     Temp 98.6 F (37 C)      Temp Source Oral     SpO2 97 %     Weight      Height      Head Circumference      Peak Flow      Pain Score 0     Pain Loc      Pain Education      Exclude from Growth Chart    No data found.  Updated Vital Signs BP 122/79 (BP Location: Right Arm)   Pulse 63   Temp 98.6 F (37 C) (Oral)   Resp 16   SpO2 97%   Visual Acuity Right Eye Distance:   Left Eye Distance:   Bilateral Distance:    Right Eye Near:   Left Eye Near:    Bilateral Near:     Physical Exam Vitals and nursing note reviewed.  Constitutional:      Appearance: Normal appearance.  HENT:     Head: Normocephalic and atraumatic.     Right Ear: External ear normal.     Left Ear: External ear normal.     Nose: Nose normal.     Mouth/Throat:     Mouth: Mucous membranes are moist.  Eyes:     Conjunctiva/sclera: Conjunctivae normal.  Cardiovascular:     Rate and Rhythm: Normal  rate.  Pulmonary:     Effort: Pulmonary effort is normal. No respiratory distress.  Neurological:     General: No focal deficit present.     Mental Status: He is alert.  Psychiatric:        Mood and Affect: Mood normal.      UC Treatments / Results  Labs (all labs ordered are listed, but only abnormal results are displayed) Labs Reviewed  SYPHILIS: RPR W/REFLEX TO RPR TITER AND TREPONEMAL ANTIBODIES, TRADITIONAL SCREENING AND DIAGNOSIS ALGORITHM  HIV ANTIBODY (ROUTINE TESTING W REFLEX)  CYTOLOGY, (ORAL, ANAL, URETHRAL) ANCILLARY ONLY    EKG   Radiology No results found.  Procedures Procedures (including critical care time)  Medications Ordered in UC Medications - No data to display  Initial Impression / Assessment and Plan / UC Course  I have reviewed the triage vital signs and the nursing notes.  Pertinent labs & imaging results that were available during my care of the patient were reviewed by me and considered in my medical decision making (see chart for details).  Vitals and triage reviewed, patient is  hemodynamically stable.  Asymptomatic STI screening,  cytology, HIV and syphilis included.  Plan of care, follow-up care return precautions given, no questions at this time.     Final Clinical Impressions(s) / UC Diagnoses   Final diagnoses:  Encounter for screening examination for sexually transmitted infection     Discharge Instructions      You have been screened for sexually transmitted infections.  Results to be back over the next 1 to 2 days via MyChart.  Staff will call you if treatment is needed based on results.  Abstain from intercourse until results have been received.  Return to clinic for new or urgent symptoms.    ED Prescriptions   None    PDMP not reviewed this encounter.    [1]  Social History Tobacco Use   Smoking status: Never   Smokeless tobacco: Never  Substance Use Topics   Alcohol use: No   Drug use: No     Dreama Jeannene SAILOR, FNP 08/10/24 1846

## 2024-08-10 NOTE — Discharge Instructions (Signed)
 You have been screened for sexually transmitted infections.  Results to be back over the next 1 to 2 days via MyChart.  Staff will call you if treatment is needed based on results.  Abstain from intercourse until results have been received.  Return to clinic for new or urgent symptoms.

## 2024-08-11 ENCOUNTER — Ambulatory Visit (HOSPITAL_COMMUNITY): Payer: Self-pay

## 2024-08-11 LAB — CYTOLOGY, (ORAL, ANAL, URETHRAL) ANCILLARY ONLY
Chlamydia: NEGATIVE
Comment: NEGATIVE
Comment: NEGATIVE
Comment: NORMAL
Neisseria Gonorrhea: NEGATIVE
Trichomonas: POSITIVE — AB

## 2024-08-11 LAB — SYPHILIS: RPR W/REFLEX TO RPR TITER AND TREPONEMAL ANTIBODIES, TRADITIONAL SCREENING AND DIAGNOSIS ALGORITHM: RPR Ser Ql: NONREACTIVE

## 2024-08-11 LAB — HIV ANTIBODY (ROUTINE TESTING W REFLEX): HIV Screen 4th Generation wRfx: NONREACTIVE

## 2024-08-11 MED ORDER — METRONIDAZOLE 500 MG PO TABS: 2000.0000 mg | ORAL_TABLET | Freq: Once | ORAL | 0 refills | Status: AC

## 2024-08-15 MED ORDER — METRONIDAZOLE 500 MG PO TABS: 500.0000 mg | ORAL_TABLET | Freq: Two times a day (BID) | ORAL | 0 refills | Status: AC
